# Patient Record
Sex: Female | Born: 1990 | Race: White | Hispanic: Yes | Marital: Single | Smoking: Never smoker
Health system: Southern US, Community
[De-identification: ages and names within clinical notes are randomized; demographics above are authoritative.]

---
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
## 2010-10-27 ENCOUNTER — Emergency Department (HOSPITAL_COMMUNITY): Admission: EM | Admit: 2010-10-27 | Discharge: 2010-10-27 | Payer: Self-pay | Admitting: Emergency Medicine

## 2011-03-09 LAB — POCT I-STAT, CHEM 8
BUN: 4 mg/dL — ABNORMAL LOW (ref 6–23)
Calcium, Ion: 1.08 mmol/L — ABNORMAL LOW (ref 1.12–1.32)
Chloride: 105 mEq/L (ref 96–112)
Creatinine, Ser: 0.9 mg/dL (ref 0.4–1.2)
Glucose, Bld: 123 mg/dL — ABNORMAL HIGH (ref 70–99)
HCT: 47 % — ABNORMAL HIGH (ref 36.0–46.0)
Hemoglobin: 16 g/dL — ABNORMAL HIGH (ref 12.0–15.0)
Potassium: 3 mEq/L — ABNORMAL LOW (ref 3.5–5.1)
Sodium: 145 mEq/L (ref 135–145)
TCO2: 25 mmol/L (ref 0–100)

## 2011-03-09 LAB — ETHANOL: Alcohol, Ethyl (B): 222 mg/dL — ABNORMAL HIGH (ref 0–10)

## 2014-12-18 ENCOUNTER — Ambulatory Visit (INDEPENDENT_AMBULATORY_CARE_PROVIDER_SITE_OTHER): Payer: Self-pay | Admitting: Family Medicine

## 2014-12-18 VITALS — BP 122/70 | HR 62 | Temp 98.3°F | Resp 18 | Ht 60.0 in | Wt 165.0 lb

## 2014-12-18 DIAGNOSIS — R1030 Lower abdominal pain, unspecified: Secondary | ICD-10-CM

## 2014-12-18 DIAGNOSIS — N939 Abnormal uterine and vaginal bleeding, unspecified: Secondary | ICD-10-CM

## 2014-12-18 DIAGNOSIS — N921 Excessive and frequent menstruation with irregular cycle: Secondary | ICD-10-CM

## 2014-12-18 LAB — POCT WET PREP WITH KOH
Bacteria Wet Prep HPF POC: NEGATIVE
Clue Cells Wet Prep HPF POC: NEGATIVE
KOH Prep POC: NEGATIVE
TRICHOMONAS UA: NEGATIVE
WBC Wet Prep HPF POC: NEGATIVE
Yeast Wet Prep HPF POC: NEGATIVE

## 2014-12-18 LAB — POCT UA - MICROSCOPIC ONLY
Bacteria, U Microscopic: NEGATIVE
Casts, Ur, LPF, POC: NEGATIVE
Crystals, Ur, HPF, POC: NEGATIVE
Mucus, UA: NEGATIVE
Yeast, UA: NEGATIVE

## 2014-12-18 LAB — POCT CBC
Granulocyte percent: 60.8 %G (ref 37–80)
HCT, POC: 42.7 % (ref 37.7–47.9)
Hemoglobin: 13.5 g/dL (ref 12.2–16.2)
Lymph, poc: 2.6 (ref 0.6–3.4)
MCH, POC: 25.3 pg — AB (ref 27–31.2)
MCHC: 31.6 g/dL — AB (ref 31.8–35.4)
MCV: 80.2 fL (ref 80–97)
MID (cbc): 0.5 (ref 0–0.9)
MPV: 7 fL (ref 0–99.8)
POC Granulocyte: 4.8 (ref 2–6.9)
POC LYMPH PERCENT: 33.5 %L (ref 10–50)
POC MID %: 5.7 %M (ref 0–12)
Platelet Count, POC: 354 10*3/uL (ref 142–424)
RBC: 5.32 M/uL (ref 4.04–5.48)
RDW, POC: 13.7 %
WBC: 7.9 10*3/uL (ref 4.6–10.2)

## 2014-12-18 LAB — POCT URINALYSIS DIPSTICK
Bilirubin, UA: NEGATIVE
Blood, UA: NEGATIVE
Glucose, UA: NEGATIVE
Ketones, UA: NEGATIVE
Leukocytes, UA: NEGATIVE
Nitrite, UA: NEGATIVE
Protein, UA: NEGATIVE
Spec Grav, UA: 1.02
Urobilinogen, UA: 0.2
pH, UA: 5

## 2014-12-18 LAB — POCT URINE PREGNANCY: Preg Test, Ur: NEGATIVE

## 2014-12-18 NOTE — Patient Instructions (Signed)
You will get a phone call to schedule your ultrasound - schedule for 1.5 weeks from now. If you stop bleeding in 1 week, you can cancel this. If you continue to have very heavy bleeding for greater than 3 days or bleeding longer than a week, keep the appointment. You can return and see me additional concerns. If you continue to have abnormal bleeding, may want to investigate further causes. Stay hydrated!

## 2014-12-18 NOTE — Progress Notes (Signed)
Subjective:    Patient ID: Olivia Parrish, female    DOB: 1991-05-14, 23 y.o.   MRN: [id number]  HPI  This is a 23 year old female presenting with heavy vaginal bleeding x 36 hours. She has noticed some "big clots" in the blood. She is using 3 pads an hour. She is having some lower abdominal cramping. She reports she has never had regular periods. There are variable amounts of time between periods. For the past 3 months she has been spotting frequently, but no period. She is sexually active with one female partner and has been with this person for 10 months. They do not use contraception. She notes last week she had 2 days of breast tenderness. She also experienced some nausea last week but not vomiting. She denies fever, chills, vaginal discharge, or urinary symptoms. She has had some back pain but attributes this to the mattress she's been sleeping on. She has never had heavy bleeding like this before.  Review of Systems  Constitutional: Negative for fever and chills.  Gastrointestinal: Positive for nausea and abdominal pain. Negative for vomiting and diarrhea.  Genitourinary: Positive for vaginal bleeding and menstrual problem. Negative for dysuria, frequency, flank pain, vaginal discharge and vaginal pain.  Musculoskeletal: Positive for back pain.  Neurological: Negative for dizziness.   There are no active problems to display for this patient.  Prior to Admission medications   Not on File   No Known Allergies  Patient's social and family history were reviewed.     Objective:   Physical Exam  Constitutional: She is oriented to person, place, and time. She appears well-developed and well-nourished. No distress.  HENT:  Head: Normocephalic and atraumatic.  Right Ear: Hearing normal.  Left Ear: Hearing normal.  Nose: Nose normal.  Eyes: Conjunctivae and lids are normal. Right eye exhibits no discharge. Left eye exhibits no discharge. No scleral icterus.  Cardiovascular: Normal  rate, regular rhythm, normal heart sounds, intact distal pulses and normal pulses.   Pulmonary/Chest: Effort normal and breath sounds normal. No respiratory distress. She has no wheezes. She has no rhonchi. She has no rales.  Abdominal: Soft. Normal appearance. There is no tenderness. There is no CVA tenderness.  Genitourinary: Uterus normal. There is no lesion on the right labia. There is no lesion on the left labia. Cervix exhibits no motion tenderness. Right adnexum displays no tenderness and no fullness. Left adnexum displays no tenderness and no fullness. There is bleeding in the vagina. No tenderness in the vagina.  Large amount of blood and large clots in vaginal vault Cervical os closed   Musculoskeletal: Normal range of motion.  Neurological: She is alert and oriented to person, place, and time.  Skin: Skin is warm, dry and intact. No lesion and no rash noted.  Psychiatric: She has a normal mood and affect. Her speech is normal and behavior is normal. Thought content normal.   Results for orders placed or performed in visit on 12/18/14  POCT urine pregnancy  Result Value Ref Range   Preg Test, Ur Negative   POCT urinalysis dipstick  Result Value Ref Range   Color, UA red    Clarity, UA clear    Glucose, UA neg    Bilirubin, UA neg    Ketones, UA neg    Spec Grav, UA 1.020    Blood, UA neg    pH, UA 5.0    Protein, UA neg    Urobilinogen, UA 0.2    Nitrite, UA  neg    Leukocytes, UA Negative   POCT UA - Microscopic Only  Result Value Ref Range   WBC, Ur, HPF, POC 0-3    RBC, urine, microscopic tntc    Bacteria, U Microscopic NEG    Mucus, UA NEG    Epithelial cells, urine per micros 0-3    Crystals, Ur, HPF, POC NEG    Casts, Ur, LPF, POC NEG    Yeast, UA NEG   POCT CBC  Result Value Ref Range   WBC 7.9 4.6 - 10.2 K/uL   Lymph, poc 2.6 0.6 - 3.4   POC LYMPH PERCENT 33.5 10 - 50 %L   MID (cbc) 0.5 0 - 0.9   POC MID % 5.7 0 - 12 %M   POC Granulocyte 4.8 2 - 6.9    Granulocyte percent 60.8 37 - 80 %G   RBC 5.32 4.04 - 5.48 M/uL   Hemoglobin 13.5 12.2 - 16.2 g/dL   HCT, POC 16.142.7 09.637.7 - 47.9 %   MCV 80.2 80 - 97 fL   MCH, POC 25.3 (A) 27 - 31.2 pg   MCHC 31.6 (A) 31.8 - 35.4 g/dL   RDW, POC 04.513.7 %   Platelet Count, POC 354 142 - 424 K/uL   MPV 7.0 0 - 99.8 fL  POCT Wet Prep with KOH  Result Value Ref Range   Trichomonas, UA Negative    Clue Cells Wet Prep HPF POC NEG    Epithelial Wet Prep HPF POC 0-7    Yeast Wet Prep HPF POC NEG    Bacteria Wet Prep HPF POC NEG    RBC Wet Prep HPF POC 14-20    WBC Wet Prep HPF POC NEG    KOH Prep POC Negative       Assessment & Plan:  1. Lower abdominal pain 2. Menorrhagia with irregular cycle 3. Abnormal uterine bleeding Urine pregnancy normal. Pt is likely have anovulation and is experiencing endometrial sloughing. UA, CBC and wet prep negative today. Pt is not anemic. TSH, G/C and CMP pending. Made referral for transvaginal ultrasound - pt will cancel if bleeding stops within next week. If not, she will keep the appointment. Discussed importance of hydration. Made referral to GYN to evaluation of anovulation. She will return any further concerns.  - POCT urine pregnancy - POCT urinalysis dipstick - POCT UA - Microscopic Only - POCT CBC - Comprehensive metabolic panel - TSH - GC/Chlamydia Probe Amp - POCT Wet Prep with KOH - US Transvaginal Non-OB; Future - Ambulatory referral to Gynecology   Roswell MinersNicole V. Dyke BrackettBush, PA-C, M[facility]  12/18/2014

## 2014-12-19 LAB — COMPREHENSIVE METABOLIC PANEL
ALBUMIN: 4.2 g/dL (ref 3.5–5.2)
ALT: 8 U/L (ref 0–35)
AST: 13 U/L (ref 0–37)
Alkaline Phosphatase: 61 U/L (ref 39–117)
BUN: 7 mg/dL (ref 6–23)
CO2: 23 mEq/L (ref 19–32)
Calcium: 9.2 mg/dL (ref 8.4–10.5)
Chloride: 103 mEq/L (ref 96–112)
Creat: 0.61 mg/dL (ref 0.50–1.10)
GLUCOSE: 120 mg/dL — AB (ref 70–99)
POTASSIUM: 3.6 meq/L (ref 3.5–5.3)
SODIUM: 137 meq/L (ref 135–145)
TOTAL PROTEIN: 7.3 g/dL (ref 6.0–8.3)
Total Bilirubin: 0.5 mg/dL (ref 0.2–1.2)

## 2014-12-19 LAB — GC/CHLAMYDIA PROBE AMP
CT Probe RNA: POSITIVE — AB
GC Probe RNA: NEGATIVE

## 2014-12-19 LAB — TSH: TSH: 3.749 u[IU]/mL (ref 0.350–4.500)

## 2014-12-22 NOTE — Progress Notes (Signed)
Patient discussed with Ms. Bush. Agree with assessment and plan of care per her note.   

## 2014-12-23 ENCOUNTER — Telehealth: Payer: Self-pay | Admitting: Physician Assistant

## 2014-12-23 DIAGNOSIS — A749 Chlamydial infection, unspecified: Secondary | ICD-10-CM

## 2014-12-23 NOTE — Telephone Encounter (Signed)
Called pt to discuss her lab results, left a VM to call back.

## 2014-12-23 NOTE — Telephone Encounter (Signed)
Patient is returning a call to Teachers Insurance and Annuity Associationicole Parrish

## 2014-12-24 ENCOUNTER — Other Ambulatory Visit: Payer: Self-pay | Admitting: Radiology

## 2014-12-24 DIAGNOSIS — R102 Pelvic and perineal pain: Secondary | ICD-10-CM

## 2014-12-25 MED ORDER — AZITHROMYCIN 250 MG PO TABS: ORAL_TABLET | ORAL | Status: DC

## 2014-12-25 NOTE — Telephone Encounter (Signed)
Informed patient she has chlamydia. Sent azithromycin to pharmacy. She will have discussion with boyfriend and send him to clinic for testing and treatment.

## 2014-12-28 ENCOUNTER — Telehealth: Payer: Self-pay

## 2014-12-28 DIAGNOSIS — A749 Chlamydial infection, unspecified: Secondary | ICD-10-CM

## 2014-12-28 NOTE — Telephone Encounter (Signed)
The patient requested a call from Lanier Clam regarding her last appointment and lab results.  She requested a call back at her work number.  CB#: [phone]

## 2014-12-30 MED ORDER — AZITHROMYCIN 250 MG PO TABS: ORAL_TABLET | ORAL | Status: AC

## 2014-12-30 NOTE — Telephone Encounter (Signed)
Tried to reach pt at work number- on hold for a long time, she was with a Financial trader.  Please advise lab results.

## 2014-12-30 NOTE — Telephone Encounter (Signed)
Pt called and reported she had unprotected sex 1 day after taking the azithromycin for chlamydia. I have resent the azithromycin to the pharmacy. We discussed the importance of abstaining for 7-10 days or at least use condoms. She will be sending her boyfriend to clinic for testing and treatment.

## 2015-11-04 ENCOUNTER — Emergency Department (HOSPITAL_COMMUNITY): Payer: Self-pay

## 2015-11-04 ENCOUNTER — Emergency Department (HOSPITAL_COMMUNITY)
Admission: EM | Admit: 2015-11-04 | Discharge: 2015-11-04 | Disposition: A | Discharge status: Home / Self Care | Payer: Self-pay | Attending: Emergency Medicine | Admitting: Emergency Medicine

## 2015-11-04 ENCOUNTER — Encounter (HOSPITAL_COMMUNITY): Payer: Self-pay

## 2015-11-04 DIAGNOSIS — Y998 Other external cause status: Secondary | ICD-10-CM | POA: Insufficient documentation

## 2015-11-04 DIAGNOSIS — Z3202 Encounter for pregnancy test, result negative: Secondary | ICD-10-CM | POA: Insufficient documentation

## 2015-11-04 DIAGNOSIS — Y92410 Unspecified street and highway as the place of occurrence of the external cause: Secondary | ICD-10-CM | POA: Insufficient documentation

## 2015-11-04 DIAGNOSIS — S3991XA Unspecified injury of abdomen, initial encounter: Secondary | ICD-10-CM | POA: Insufficient documentation

## 2015-11-04 DIAGNOSIS — S299XXA Unspecified injury of thorax, initial encounter: Secondary | ICD-10-CM | POA: Insufficient documentation

## 2015-11-04 DIAGNOSIS — Y9389 Activity, other specified: Secondary | ICD-10-CM | POA: Insufficient documentation

## 2015-11-04 DIAGNOSIS — S199XXA Unspecified injury of neck, initial encounter: Secondary | ICD-10-CM | POA: Insufficient documentation

## 2015-11-04 LAB — COMPREHENSIVE METABOLIC PANEL
ALK PHOS: 66 U/L (ref 38–126)
ALT: 28 U/L (ref 14–54)
AST: 31 U/L (ref 15–41)
Albumin: 4.2 g/dL (ref 3.5–5.0)
Anion gap: 11 (ref 5–15)
BILIRUBIN TOTAL: 0.7 mg/dL (ref 0.3–1.2)
BUN: 8 mg/dL (ref 6–20)
CALCIUM: 9.4 mg/dL (ref 8.9–10.3)
CO2: 23 mmol/L (ref 22–32)
CREATININE: 0.6 mg/dL (ref 0.44–1.00)
Chloride: 105 mmol/L (ref 101–111)
Glucose, Bld: 86 mg/dL (ref 65–99)
Potassium: 3.4 mmol/L — ABNORMAL LOW (ref 3.5–5.1)
Sodium: 139 mmol/L (ref 135–145)
Total Protein: 7.4 g/dL (ref 6.5–8.1)

## 2015-11-04 LAB — CBC WITH DIFFERENTIAL/PLATELET
BASOS ABS: 0 10*3/uL (ref 0.0–0.1)
Basophils Relative: 0 %
EOS PCT: 1 %
Eosinophils Absolute: 0.1 10*3/uL (ref 0.0–0.7)
HEMATOCRIT: 45.6 % (ref 36.0–46.0)
Hemoglobin: 15.6 g/dL — ABNORMAL HIGH (ref 12.0–15.0)
LYMPHS ABS: 2.6 10*3/uL (ref 0.7–4.0)
LYMPHS PCT: 30 %
MCH: 26.6 pg (ref 26.0–34.0)
MCHC: 34.2 g/dL (ref 30.0–36.0)
MCV: 77.8 fL — AB (ref 78.0–100.0)
Monocytes Absolute: 0.5 10*3/uL (ref 0.1–1.0)
Monocytes Relative: 6 %
NEUTROS ABS: 5.5 10*3/uL (ref 1.7–7.7)
Neutrophils Relative %: 63 %
Platelets: 335 10*3/uL (ref 150–400)
RBC: 5.86 MIL/uL — AB (ref 3.87–5.11)
RDW: 13.1 % (ref 11.5–15.5)
WBC: 8.7 10*3/uL (ref 4.0–10.5)

## 2015-11-04 LAB — POC URINE PREG, ED: PREG TEST UR: NEGATIVE

## 2015-11-04 MED ORDER — HYDROCODONE-ACETAMINOPHEN 5-325 MG PO TABS
1.0000 | ORAL_TABLET | Freq: Once | ORAL | Status: AC
  Administered 2015-11-04: 1 via ORAL
  Filled 2015-11-04: qty 1

## 2015-11-04 MED ORDER — IBUPROFEN 800 MG PO TABS: 800.0000 mg | ORAL_TABLET | Freq: Three times a day (TID) | ORAL | Status: AC

## 2015-11-04 MED ORDER — CYCLOBENZAPRINE HCL 10 MG PO TABS: 10.0000 mg | ORAL_TABLET | Freq: Two times a day (BID) | ORAL | Status: AC | PRN
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
